# Patient Record
Sex: Female | Born: 1970 | Race: Black or African American | Hispanic: No | Marital: Single | Smoking: Never smoker
Health system: Southern US, Community
[De-identification: ages and names within clinical notes are randomized; demographics above are authoritative.]

## PROBLEM LIST (undated history)

## (undated) DIAGNOSIS — I10 Essential (primary) hypertension: Secondary | ICD-10-CM

## (undated) DIAGNOSIS — Z8619 Personal history of other infectious and parasitic diseases: Secondary | ICD-10-CM

## (undated) DIAGNOSIS — IMO0002 Reserved for concepts with insufficient information to code with codable children: Secondary | ICD-10-CM

## (undated) DIAGNOSIS — D249 Benign neoplasm of unspecified breast: Secondary | ICD-10-CM

## (undated) DIAGNOSIS — E119 Type 2 diabetes mellitus without complications: Secondary | ICD-10-CM

## (undated) DIAGNOSIS — E785 Hyperlipidemia, unspecified: Secondary | ICD-10-CM

## (undated) DIAGNOSIS — Z87898 Personal history of other specified conditions: Secondary | ICD-10-CM

## (undated) DIAGNOSIS — R002 Palpitations: Secondary | ICD-10-CM

## (undated) HISTORY — DX: Personal history of other infectious and parasitic diseases: Z86.19

## (undated) HISTORY — DX: Reserved for concepts with insufficient information to code with codable children: IMO0002

## (undated) HISTORY — DX: Personal history of other specified conditions: Z87.898

## (undated) HISTORY — PX: BREAST EXCISIONAL BIOPSY: SUR124

## (undated) HISTORY — DX: Hyperlipidemia, unspecified: E78.5

## (undated) HISTORY — PX: WISDOM TOOTH EXTRACTION: SHX21

## (undated) HISTORY — DX: Palpitations: R00.2

## (undated) HISTORY — DX: Benign neoplasm of unspecified breast: D24.9

## (undated) HISTORY — DX: Type 2 diabetes mellitus without complications: E11.9

## (undated) HISTORY — DX: Essential (primary) hypertension: I10

## (undated) HISTORY — PX: OTHER SURGICAL HISTORY: SHX169

## (undated) HISTORY — PX: BREAST BIOPSY: SHX20

---
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
## 1991-07-21 DIAGNOSIS — D249 Benign neoplasm of unspecified breast: Secondary | ICD-10-CM

## 1991-07-21 HISTORY — DX: Benign neoplasm of unspecified breast: D24.9

## 2000-07-20 HISTORY — PX: LEEP: SHX91

## 2009-03-04 ENCOUNTER — Encounter: Payer: Self-pay | Admitting: Cardiology

## 2009-07-22 ENCOUNTER — Encounter: Payer: Self-pay | Admitting: Cardiology

## 2009-07-25 DIAGNOSIS — R079 Chest pain, unspecified: Secondary | ICD-10-CM | POA: Insufficient documentation

## 2009-07-26 ENCOUNTER — Ambulatory Visit: Payer: Self-pay | Admitting: Cardiology

## 2009-07-26 DIAGNOSIS — R9431 Abnormal electrocardiogram [ECG] [EKG]: Secondary | ICD-10-CM | POA: Insufficient documentation

## 2009-07-29 ENCOUNTER — Encounter: Payer: Self-pay | Admitting: Cardiology

## 2009-07-31 ENCOUNTER — Encounter: Payer: Self-pay | Admitting: Cardiology

## 2009-08-01 ENCOUNTER — Ambulatory Visit: Payer: Self-pay | Admitting: Cardiology

## 2009-08-01 ENCOUNTER — Encounter: Payer: Self-pay | Admitting: Cardiology

## 2009-08-08 ENCOUNTER — Encounter (INDEPENDENT_AMBULATORY_CARE_PROVIDER_SITE_OTHER): Payer: Self-pay | Admitting: *Deleted

## 2010-08-19 NOTE — Letter (Signed)
Summary: [facility]   Imported By: Marylou Mccoy 09/05/2009 08:34:24  _____________________________________________________________________  External Attachment:    Type:   Image     Comment:   External Document

## 2010-08-19 NOTE — Letter (Signed)
Summary: [facility]   Imported By: Marylou Mccoy 09/05/2009 10:21:13  _____________________________________________________________________  External Attachment:    Type:   Image     Comment:   External Document

## 2010-08-19 NOTE — Letter (Signed)
Summary: Engineer, materials at [facility]  [address]   [place], [place] [address]   Phone: [phone]  Fax: [phone]        August 08, 2009 MRN: [id number]    [facility] [address] [place], [place]  [address]    Dear Ms. Joslyn,  Your test ordered by Selena Batten has been reviewed by your physician (or physician assistant) and was found to be normal or stable. Your physician (or physician assistant) felt no changes were needed at this time.  ____ Echocardiogram  __X__ Cardiac Stress Test-No further cardiac testing planned unless symptoms progress.  ____ Lab Work  ____ Peripheral vascular study of arms, legs or neck  ____ CT scan or X-ray  ____ Lung or Breathing test  ____ Other:   Thank you.   Cyril Loosen, RN, BSN    Duane Boston, M.D., F.A.C.C. Thressa Sheller, M.D., F.A.C.C. Oneal Grout, M.D., F.A.C.C. Cheree Ditto, M.D., F.A.C.C. Daiva Nakayama, M.D., F.A.C.C. Kenney Houseman, M.D., F.A.C.C. Jeanne Ivan, PA-C

## 2010-08-19 NOTE — Assessment & Plan Note (Signed)
Summary: NP-ABNORMAL EKG & ATYPICAL CP   Visit Type:  Initial Consult Primary Provider:  Dr. Isac Sarna   History of Present Illness: 40 year old woman referred with a history of chest pain and abnormal electrocardiogram. She reports a one-year history of intermittent chest pain, typically no more than one occurrence in a month, and described as a sporadic "tightness" with shortness of breath, lasting no more than 5 seconds. The symptoms are nonexertional in nature, and the patient describes problems with "stress" unprompted. She is caring for her mother, with whom she lives, suffering with Alzheimer's dementia and other chronic medical problems.  Ms. Steger denies having any prior cardiac testing. She has rare palpitations, not associated with the present symptoms. She does have some family history, specifically in her mother, but otherwise no major premature cardiovascular disease history in the family. Her electrocardiogram obtained recently on 3 January shows normal sinus rhythm with poor R-wave progression, but not frank anterior Q waves. This may well be related to lead placement and breast tissue, doubt anterior infarction. I reviewed this with her today.  Preventive Screening-Counseling & Management  Alcohol-Tobacco     Smoking Status: never  Current Medications (verified): 1)  None  Allergies (verified): No Known Drug Allergies  Comments:  Nurse/Medical Assistant: The patient is currently on no medications and no changes to the medication list were required.  Past History:  Family History: Last updated: 07/26/2009 Mother had MI age 31 (she had had two strokes prior to the MI). Also has Alzheimers dementia and Diabetes Mellitus. Father deceased with multiple sclerosis. She has siblings who are healthy.  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 Also has Alzheimers dementia and Diabetes Mellitus. Father deceased with multiple sclerosis. She has siblings who are healthy.  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         Otherwise reviewed and negative.  Vital Signs:  Patient profile:   40 year old female Height:      67 inches Weight:      213 pounds BMI:     33.48 O2 Sat:      96 % Pulse rate:   101 / minute BP sitting:   131 / 93  (left arm) Cuff size:   large  Vitals Entered By: Carlye Grippe (July 26, 2009 10:19 AM)  Nutrition Counseling: Patient's BMI is greater than 25 and therefore counseled on weight management options.   Physical Exam  Additional Exam:  Obese woman in no acute distress. HEENT: Conjunctiva and lids normal, oropharynx clear. Neck: Supple, no carotid bruits, no thyromegaly. Lungs: Clear to auscultation, nonlabored. Cardiac: Regular rate and rhythm,  no significant murmur, no S3. Abdomen: Soft, nontender, bowel sounds present, no bruits. Skin: Warm and dry. Extremities: No pitting edema, distal pulses 2+. Musculoskeletal: No kyphosis. Neuropsychiatric: Alert and oriented x3,  affect appropriate.   EKG  Procedure date:  07/22/2009  Findings:      Normal sinus rhythm at 91 beats per minute with poor R-wave progression. Nonspecific T-wave changes.  Impression & Recommendations:  Problem # 1:  CHEST PAIN UNSPECIFIED (ICD-786.50)  Atypical, sporadic symptoms as outlined above over the last year, in the setting of psychosocial stressors. Electrocardiogram is nonspecific. Suspect that poor R-wave progression is related to lead placement or breast tissue. There may be some component of family history of cardiovascular disease in the patient's mother, however no other major cardiac risk factors known at this point. Blood pressure is elevated today, although there is no standing history, with recent blood pressure of 118/84 on evaluation with Dr. Margo Aye. We have discussed the matter today, and will plan an exercise echocardiogram for ischemic surveillance. If this test is low risk, I would not anticipate any further evaluation unless she has progressive symptoms. If significant abnormalities are uncovered, we will see her back and discuss the matter further.  Orders: Echo- Stress (Stress Echo)  Problem # 2:  ABNORMAL ELECTROCARDIOGRAM (ICD-794.31)  As noted above, suspect that this is a nonspecific finding.  Patient Instructions: 1)  exercise echo 2)  Follow up as needed.

## 2010-08-19 NOTE — Letter (Signed)
Summary: [facility]  [facility]   Imported By: Zachary George 07/25/2009 15:30:57  _____________________________________________________________________  External Attachment:    Type:   Image     Comment:   External Document

## 2011-01-23 ENCOUNTER — Encounter: Payer: Self-pay | Admitting: Cardiology

## 2011-02-10 DIAGNOSIS — Z87898 Personal history of other specified conditions: Secondary | ICD-10-CM

## 2011-02-10 HISTORY — DX: Personal history of other specified conditions: Z87.898

## 2012-03-03 ENCOUNTER — Encounter: Payer: Self-pay | Admitting: Obstetrics and Gynecology

## 2012-03-03 ENCOUNTER — Ambulatory Visit (INDEPENDENT_AMBULATORY_CARE_PROVIDER_SITE_OTHER): Payer: BC Managed Care – PPO | Admitting: Obstetrics and Gynecology

## 2012-03-03 VITALS — BP 120/88 | HR 72 | Ht 67.0 in | Wt 211.0 lb

## 2012-03-03 DIAGNOSIS — Z01419 Encounter for gynecological examination (general) (routine) without abnormal findings: Secondary | ICD-10-CM

## 2012-03-03 DIAGNOSIS — Z124 Encounter for screening for malignant neoplasm of cervix: Secondary | ICD-10-CM
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
## 2012-03-03 NOTE — Progress Notes (Signed)
Subjective:    Anne Key is a 41 y.o. female, G0P0, who presents for an annual exam. The patient reports no complaints.  Menstrual cycle:   LMP: Patient's last menstrual period was 02/12/2012.             Review of Systems Pertinent items are noted in HPI. Denies pelvic pain, urinary tract symptoms, vaginitis symptoms, irregular bleeding, menopausal symptoms, change in bowel habits or rectal bleeding   Objective:    BP 120/88  Pulse 72  Ht 5\' 7"  (1.702 m)  Wt 211 lb (95.709 kg)  BMI 33.05 kg/m2  LMP 02/12/2012    Wt Readings from Last 1 Encounters:  03/03/12 211 lb (95.709 kg)   Body mass index is 33.05 kg/(m^2).  General Appearance: Alert, no acute distress HEENT: Grossly normal Neck / Thyroid: Supple, no thyromegaly or cervical adenopathy Lungs: Clear to auscultation bilaterally Back: No CVA tenderness Breast Exam: No masses or nodes.No dimpling, nipple retraction or discharge. Cardiovascular: Regular rate and rhythm.  Gastrointestinal: Soft, non-tender, no masses or organomegaly Pelvic Exam: EGBUS-wnl, vagina-normal rugae, cervix- without lesions or tenderness, uterus appears normal size shape and consistency, adnexae-no masses or tenderness Rectovaginal: no masses and normal sphincter tone Lymphatic Exam: Non-palpable nodes in neck, clavicular,  axillary, or inguinal regions  Skin: no rashes or abnormalities Extremities: no clubbing cyanosis or edema  Neurologic: grossly normal Psychiatric: Alert and oriented  Assessment:   Routine GYN Exam   Plan:    PAP sent  Screening MG @ [facility] in [place]  RTO 1 year or prn  Anntionette Madkins,ELMIRAPA-C

## 2012-03-04 ENCOUNTER — Telehealth: Payer: Self-pay | Admitting: Obstetrics and Gynecology

## 2012-03-04 LAB — PAP IG W/ RFLX HPV ASCU

## 2012-03-04 NOTE — Telephone Encounter (Signed)
[facility] WANTED A WRITTEN ORDER FOR PT MAMMO AND I FAXED IN THE ORDER.

## 2012-03-07 ENCOUNTER — Encounter: Payer: Self-pay | Admitting: Obstetrics and Gynecology

## 2012-03-10 ENCOUNTER — Telehealth: Payer: Self-pay

## 2012-03-10 NOTE — Telephone Encounter (Signed)
Triage/general quest. 

## 2012-03-10 NOTE — Telephone Encounter (Signed)
TC TO PT REGARDING MESSAGE. PT WANTED SUGGESTIONS ON WHAT TO TAKE FOR HOT FLASHES AND I SUGGESTED [employer] TO START WITH AND I TOLD PT SHE CAN GET IT AT [facility] OR [facility] STORE. PT STATES THAT SHE WILL TRY IT AND I INFORMED PT THAT IF SHE GETS NO RESULTS TO CALL BACK. PT VOICED UNDERSTANDING.

## 2012-04-01 ENCOUNTER — Encounter: Payer: Self-pay | Admitting: Obstetrics and Gynecology

## 2012-05-31 ENCOUNTER — Telehealth: Payer: Self-pay | Admitting: Obstetrics and Gynecology

## 2012-05-31 MED ORDER — NORETHIN ACE-ETH ESTRAD-FE 1-20 MG-MCG PO TABS: ORAL_TABLET | ORAL | Status: DC

## 2012-05-31 NOTE — Telephone Encounter (Signed)
Ep to address  

## 2012-05-31 NOTE — Telephone Encounter (Signed)
[name],   Did you get the message from Isabel about this pt?

## 2012-05-31 NOTE — Telephone Encounter (Signed)
Call from patient who has been having frequent hot flashes with rashes.  Was told by Dermatologist to try a trial of hormonal therapy to see if that would help. Patient denies migraines, gallbladder issues or tobacco [place].  To begin Microgestin 1/20  2 today in divided doses, 2 tomorrow then 1 daily 5 refills.  Patient was agreeable.  To follow up in 6 months.  Terianne Thaker, PA-C

## 2015-11-19 ENCOUNTER — Ambulatory Visit
Admission: RE | Admit: 2015-11-19 | Discharge: 2015-11-19 | Disposition: A | Discharge status: Home / Self Care | Payer: BLUE CROSS/BLUE SHIELD | Source: Ambulatory Visit | Attending: Internal Medicine | Admitting: Internal Medicine

## 2015-11-19 ENCOUNTER — Other Ambulatory Visit: Payer: Self-pay | Admitting: Internal Medicine

## 2015-11-19 DIAGNOSIS — R51 Headache: Principal | ICD-10-CM

## 2015-11-19 DIAGNOSIS — R519 Headache, unspecified: Secondary | ICD-10-CM

## 2016-02-18 DIAGNOSIS — L709 Acne, unspecified: Secondary | ICD-10-CM | POA: Diagnosis not present

## 2016-02-18 DIAGNOSIS — L639 Alopecia areata, unspecified: Secondary | ICD-10-CM | POA: Diagnosis not present

## 2016-04-24 DIAGNOSIS — Z6835 Body mass index (BMI) 35.0-35.9, adult: Secondary | ICD-10-CM | POA: Diagnosis not present

## 2016-04-24 DIAGNOSIS — Z124 Encounter for screening for malignant neoplasm of cervix: Secondary | ICD-10-CM | POA: Diagnosis not present

## 2016-04-24 DIAGNOSIS — Z3040 Encounter for surveillance of contraceptives, unspecified: Secondary | ICD-10-CM | POA: Diagnosis not present

## 2016-04-24 DIAGNOSIS — Z1231 Encounter for screening mammogram for malignant neoplasm of breast: Secondary | ICD-10-CM | POA: Diagnosis not present

## 2016-04-24 DIAGNOSIS — Z01419 Encounter for gynecological examination (general) (routine) without abnormal findings: Secondary | ICD-10-CM | POA: Diagnosis not present

## 2016-04-28 DIAGNOSIS — L309 Dermatitis, unspecified: Secondary | ICD-10-CM | POA: Diagnosis not present

## 2016-04-28 DIAGNOSIS — L659 Nonscarring hair loss, unspecified: Secondary | ICD-10-CM | POA: Diagnosis not present

## 2016-08-13 DIAGNOSIS — R399 Unspecified symptoms and signs involving the genitourinary system: Secondary | ICD-10-CM | POA: Diagnosis not present

## 2016-08-13 DIAGNOSIS — J209 Acute bronchitis, unspecified: Secondary | ICD-10-CM | POA: Diagnosis not present

## 2016-09-24 DIAGNOSIS — M79671 Pain in right foot: Secondary | ICD-10-CM | POA: Diagnosis not present

## 2016-09-24 DIAGNOSIS — M722 Plantar fascial fibromatosis: Secondary | ICD-10-CM | POA: Diagnosis not present

## 2016-10-14 DIAGNOSIS — M722 Plantar fascial fibromatosis: Secondary | ICD-10-CM | POA: Diagnosis not present

## 2016-10-14 DIAGNOSIS — M79671 Pain in right foot: Secondary | ICD-10-CM | POA: Diagnosis not present

## 2016-11-04 DIAGNOSIS — M79671 Pain in right foot: Secondary | ICD-10-CM | POA: Diagnosis not present

## 2016-11-04 DIAGNOSIS — M722 Plantar fascial fibromatosis: Secondary | ICD-10-CM | POA: Diagnosis not present

## 2016-12-02 DIAGNOSIS — M722 Plantar fascial fibromatosis: Secondary | ICD-10-CM | POA: Diagnosis not present

## 2016-12-02 DIAGNOSIS — M79671 Pain in right foot: Secondary | ICD-10-CM | POA: Diagnosis not present

## 2016-12-03 DIAGNOSIS — R42 Dizziness and giddiness: Secondary | ICD-10-CM | POA: Diagnosis not present

## 2016-12-03 DIAGNOSIS — L258 Unspecified contact dermatitis due to other agents: Secondary | ICD-10-CM | POA: Diagnosis not present

## 2016-12-03 DIAGNOSIS — N390 Urinary tract infection, site not specified: Secondary | ICD-10-CM | POA: Diagnosis not present

## 2016-12-03 DIAGNOSIS — R5383 Other fatigue: Secondary | ICD-10-CM | POA: Diagnosis not present

## 2016-12-10 DIAGNOSIS — R42 Dizziness and giddiness: Secondary | ICD-10-CM | POA: Diagnosis not present

## 2016-12-10 DIAGNOSIS — I10 Essential (primary) hypertension: Secondary | ICD-10-CM | POA: Diagnosis not present

## 2016-12-10 DIAGNOSIS — J309 Allergic rhinitis, unspecified: Secondary | ICD-10-CM | POA: Diagnosis not present

## 2016-12-10 DIAGNOSIS — R5383 Other fatigue: Secondary | ICD-10-CM | POA: Diagnosis not present

## 2016-12-22 DIAGNOSIS — E559 Vitamin D deficiency, unspecified: Secondary | ICD-10-CM | POA: Diagnosis not present

## 2016-12-22 DIAGNOSIS — E118 Type 2 diabetes mellitus with unspecified complications: Secondary | ICD-10-CM | POA: Diagnosis not present

## 2016-12-22 DIAGNOSIS — I10 Essential (primary) hypertension: Secondary | ICD-10-CM | POA: Diagnosis not present

## 2016-12-22 DIAGNOSIS — N390 Urinary tract infection, site not specified: Secondary | ICD-10-CM | POA: Diagnosis not present

## 2016-12-22 DIAGNOSIS — E7800 Pure hypercholesterolemia, unspecified: Secondary | ICD-10-CM | POA: Diagnosis not present

## 2016-12-29 DIAGNOSIS — I10 Essential (primary) hypertension: Secondary | ICD-10-CM | POA: Diagnosis not present

## 2016-12-29 DIAGNOSIS — E119 Type 2 diabetes mellitus without complications: Secondary | ICD-10-CM | POA: Diagnosis not present

## 2016-12-29 DIAGNOSIS — Z0000 Encounter for general adult medical examination without abnormal findings: Secondary | ICD-10-CM | POA: Diagnosis not present

## 2016-12-29 DIAGNOSIS — E7800 Pure hypercholesterolemia, unspecified: Secondary | ICD-10-CM | POA: Diagnosis not present

## 2017-04-02 DIAGNOSIS — E118 Type 2 diabetes mellitus with unspecified complications: Secondary | ICD-10-CM | POA: Diagnosis not present

## 2017-04-02 DIAGNOSIS — I10 Essential (primary) hypertension: Secondary | ICD-10-CM | POA: Diagnosis not present

## 2017-04-02 DIAGNOSIS — E7800 Pure hypercholesterolemia, unspecified: Secondary | ICD-10-CM | POA: Diagnosis not present

## 2017-04-26 DIAGNOSIS — Z6835 Body mass index (BMI) 35.0-35.9, adult: Secondary | ICD-10-CM | POA: Diagnosis not present

## 2017-04-26 DIAGNOSIS — Z1231 Encounter for screening mammogram for malignant neoplasm of breast: Secondary | ICD-10-CM | POA: Diagnosis not present

## 2017-04-26 DIAGNOSIS — Z01419 Encounter for gynecological examination (general) (routine) without abnormal findings: Secondary | ICD-10-CM | POA: Diagnosis not present

## 2017-04-26 DIAGNOSIS — Z124 Encounter for screening for malignant neoplasm of cervix: Secondary | ICD-10-CM | POA: Diagnosis not present

## 2017-05-07 DIAGNOSIS — H3561 Retinal hemorrhage, right eye: Secondary | ICD-10-CM | POA: Diagnosis not present

## 2017-06-24 DIAGNOSIS — H40013 Open angle with borderline findings, low risk, bilateral: Secondary | ICD-10-CM | POA: Diagnosis not present

## 2017-07-01 DIAGNOSIS — E118 Type 2 diabetes mellitus with unspecified complications: Secondary | ICD-10-CM | POA: Diagnosis not present

## 2017-07-01 DIAGNOSIS — I10 Essential (primary) hypertension: Secondary | ICD-10-CM | POA: Diagnosis not present

## 2017-08-02 DIAGNOSIS — E7800 Pure hypercholesterolemia, unspecified: Secondary | ICD-10-CM | POA: Diagnosis not present

## 2017-08-02 DIAGNOSIS — I10 Essential (primary) hypertension: Secondary | ICD-10-CM | POA: Diagnosis not present

## 2017-08-02 DIAGNOSIS — E118 Type 2 diabetes mellitus with unspecified complications: Secondary | ICD-10-CM | POA: Diagnosis not present

## 2017-09-21 IMAGING — CT CT HEAD W/O CM
3 of 4 series · 16 of 47 positions shown, 19 images · non-contrast
Comparison: None.

CLINICAL DATA: 44-year-old female with headaches for 2 weeks. Acute
intractable headache. Dizziness. No known injury. Initial encounter.

EXAM:
CT HEAD WITHOUT CONTRAST
TECHNIQUE: Contiguous axial images were obtained from the base of the skull
through the vertex without intravenous contrast.

[Series 32: 3d filtered head w/o · axial · non-contrast · 0.49mm/px · z∈[-31,+114]mm · 10 of 35 slices shown, 13 images]
[im 3/35  brain]
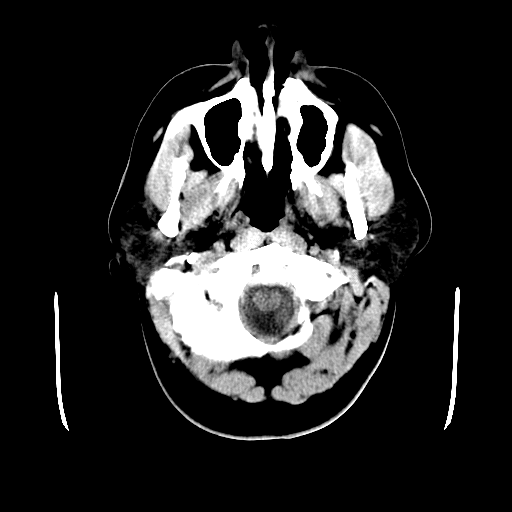
[im 3/35  bone]
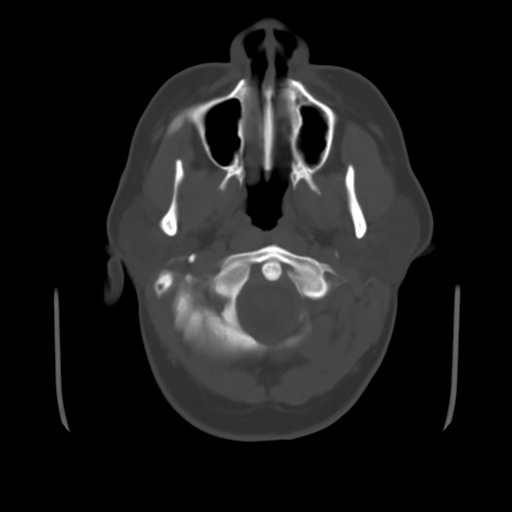
[im 5/35  brain]
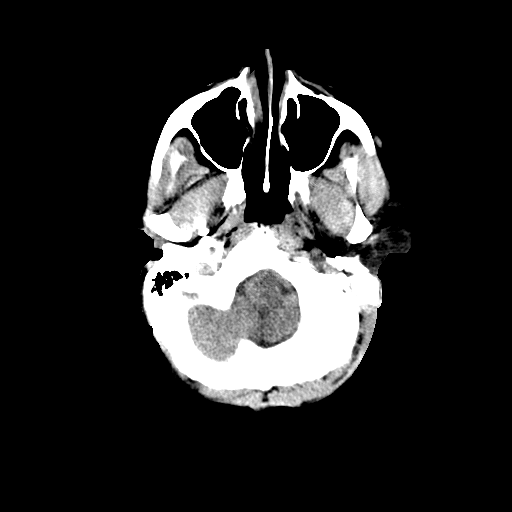
[im 10/35  brain]
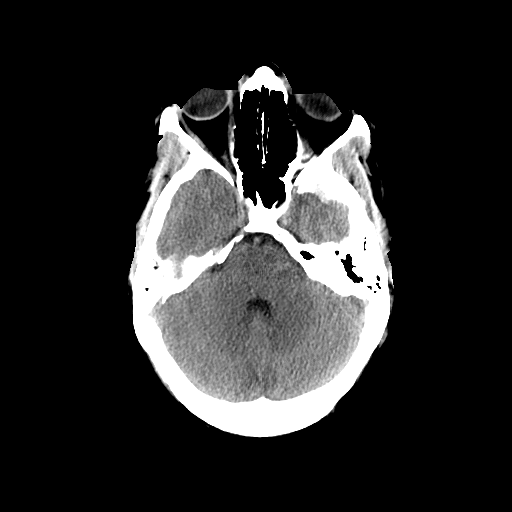
[im 13/35  brain]
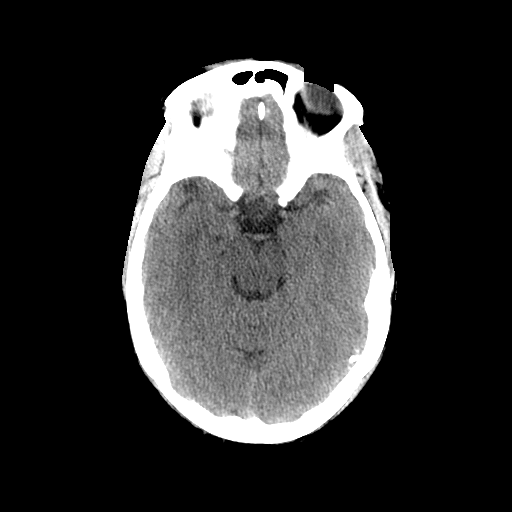
[im 15/35  brain]
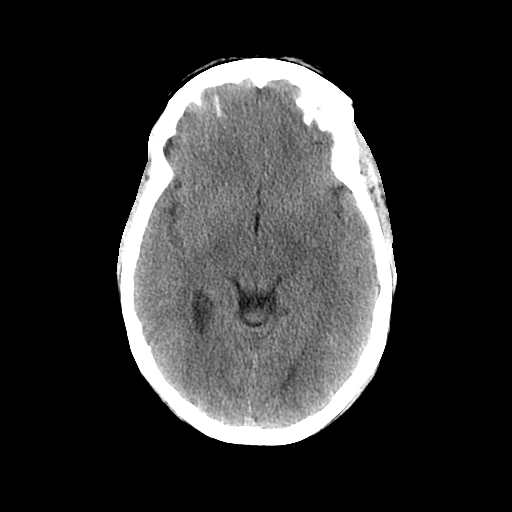
[im 15/35  bone]
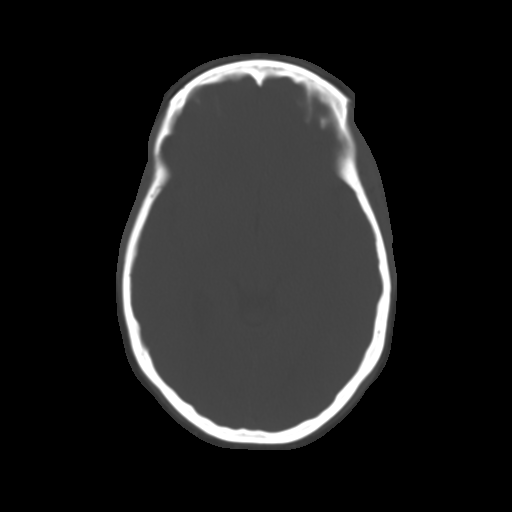
[im 20/35  brain]
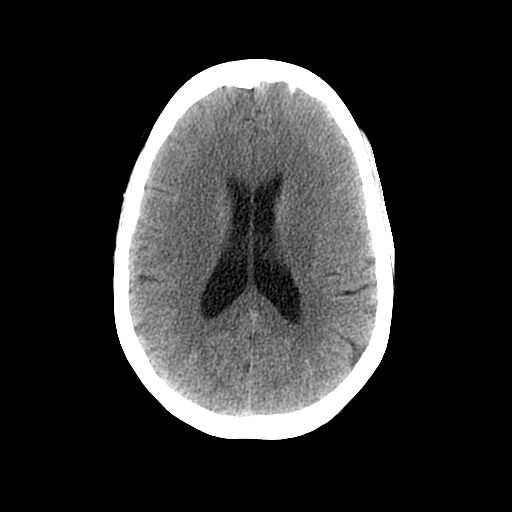
[im 22/35  brain]
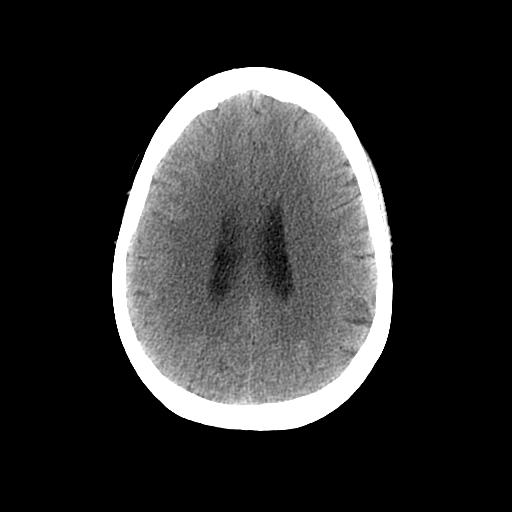
[im 25/35  brain]
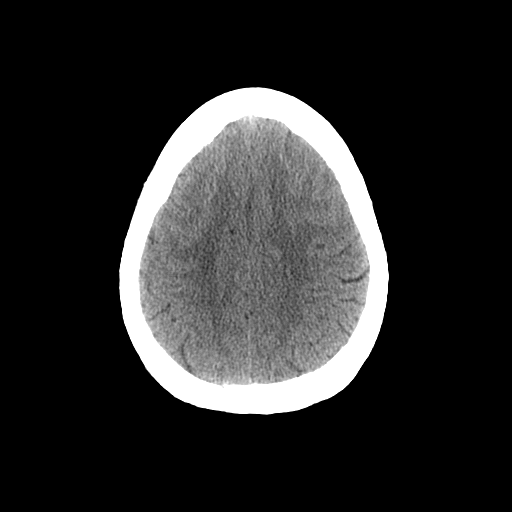
[im 30/35  brain]
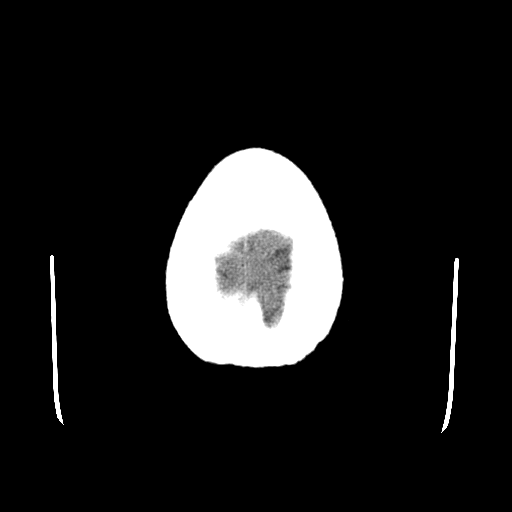
[im 30/35  bone]
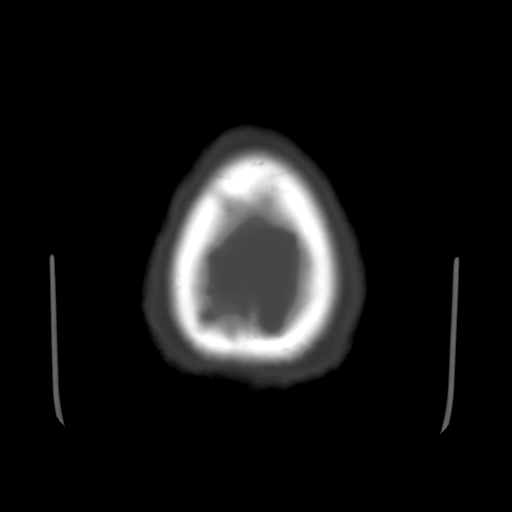
[im 32/35  brain]
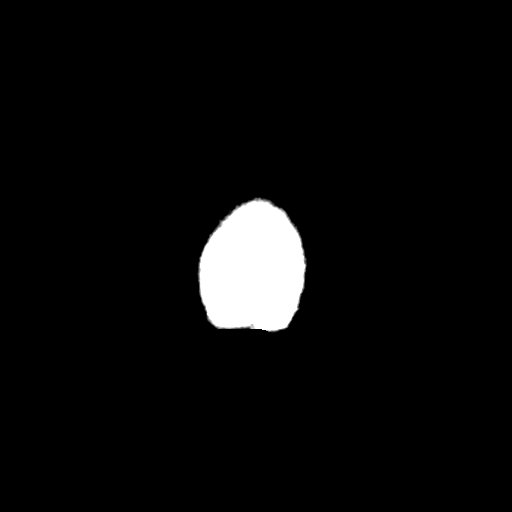

[Series 601: coronal brain · coronal · 0.49mm/px · 3 of 75 slices shown]
[im 25/75  brain]
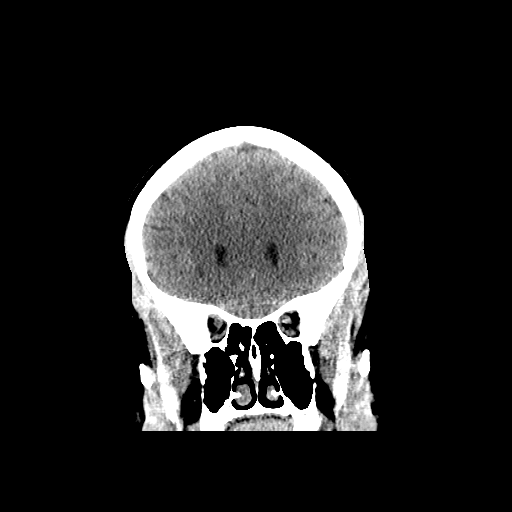
[im 33/75  brain]
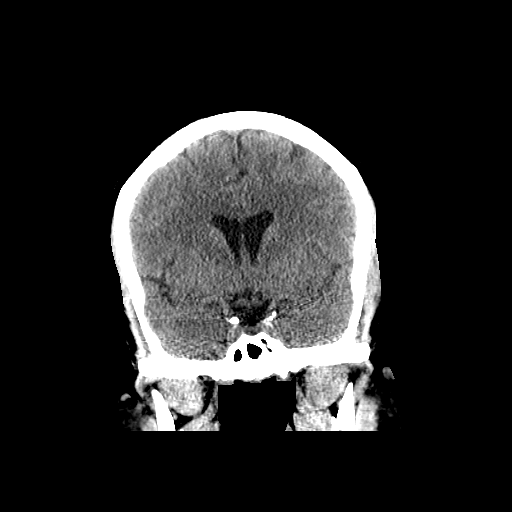
[im 42/75  brain]
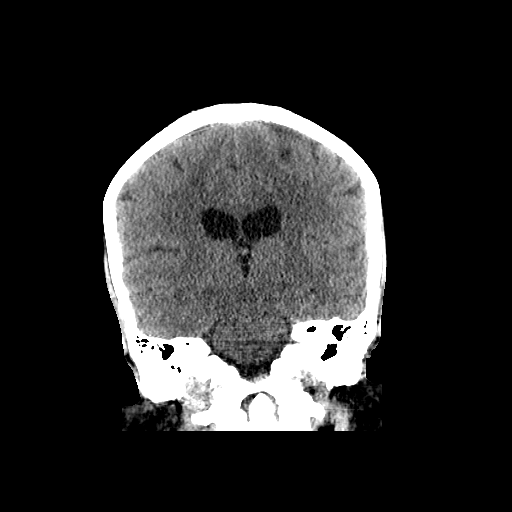

[Series 602: sagittal brain · sagittal · 0.49mm/px · 3 of 58 slices shown]
[im 20/58  brain]
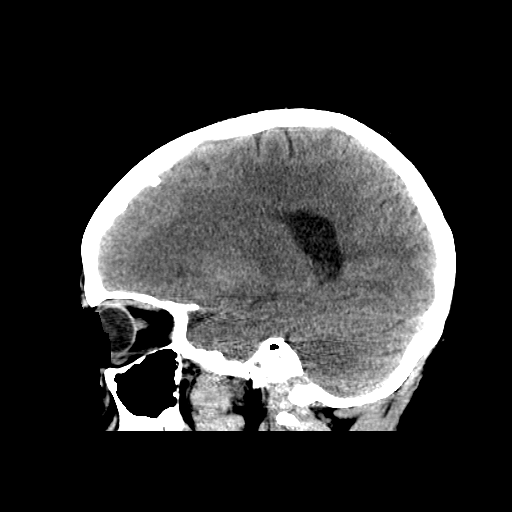
[im 29/58  brain]
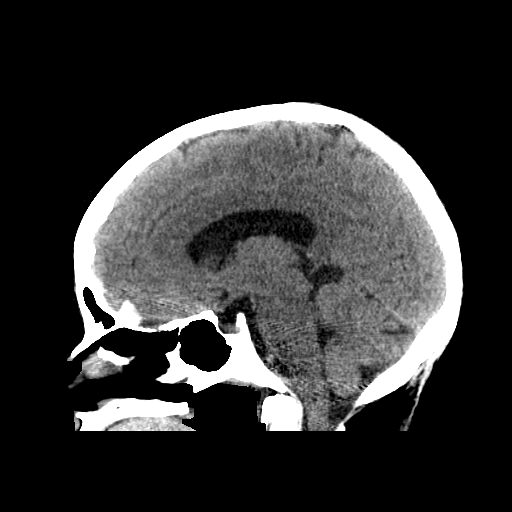
[im 39/58  brain]
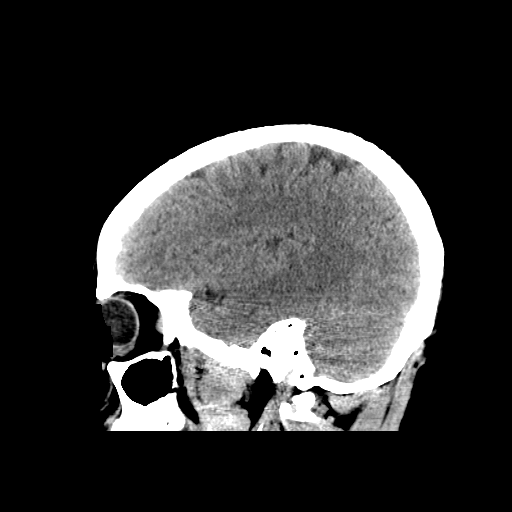

[16 of 47 positions shown; findings below may reference images not displayed]

FINDINGS: Visualized paranasal sinuses and mastoids are clear. No osseous
abnormality identified. Visualized orbits and scalp soft tissues are
within normal limits.

Cerebral volume is within normal limits. No midline shift,
ventriculomegaly, mass effect, evidence of mass lesion, intracranial
hemorrhage or evidence of cortically based acute infarction.
Gray-white matter differentiation is within normal limits throughout
the brain. No suspicious intracranial vascular hyperdensity.
IMPRESSION: Normal noncontrast CT appearance of the brain.

## 2017-12-24 DIAGNOSIS — E118 Type 2 diabetes mellitus with unspecified complications: Secondary | ICD-10-CM | POA: Diagnosis not present

## 2017-12-24 DIAGNOSIS — I10 Essential (primary) hypertension: Secondary | ICD-10-CM | POA: Diagnosis not present

## 2017-12-24 DIAGNOSIS — N390 Urinary tract infection, site not specified: Secondary | ICD-10-CM | POA: Diagnosis not present

## 2017-12-31 DIAGNOSIS — Z0000 Encounter for general adult medical examination without abnormal findings: Secondary | ICD-10-CM | POA: Diagnosis not present

## 2017-12-31 DIAGNOSIS — E118 Type 2 diabetes mellitus with unspecified complications: Secondary | ICD-10-CM | POA: Diagnosis not present

## 2017-12-31 DIAGNOSIS — I10 Essential (primary) hypertension: Secondary | ICD-10-CM | POA: Diagnosis not present

## 2017-12-31 DIAGNOSIS — E7800 Pure hypercholesterolemia, unspecified: Secondary | ICD-10-CM | POA: Diagnosis not present

## 2018-03-28 DIAGNOSIS — I10 Essential (primary) hypertension: Secondary | ICD-10-CM | POA: Diagnosis not present

## 2018-03-28 DIAGNOSIS — E7800 Pure hypercholesterolemia, unspecified: Secondary | ICD-10-CM | POA: Diagnosis not present

## 2018-03-28 DIAGNOSIS — E119 Type 2 diabetes mellitus without complications: Secondary | ICD-10-CM | POA: Diagnosis not present

## 2018-04-05 DIAGNOSIS — E559 Vitamin D deficiency, unspecified: Secondary | ICD-10-CM | POA: Diagnosis not present

## 2018-04-05 DIAGNOSIS — I10 Essential (primary) hypertension: Secondary | ICD-10-CM | POA: Diagnosis not present

## 2018-04-05 DIAGNOSIS — E7800 Pure hypercholesterolemia, unspecified: Secondary | ICD-10-CM | POA: Diagnosis not present

## 2018-04-05 DIAGNOSIS — E119 Type 2 diabetes mellitus without complications: Secondary | ICD-10-CM | POA: Diagnosis not present

## 2018-04-20 DIAGNOSIS — E7800 Pure hypercholesterolemia, unspecified: Secondary | ICD-10-CM | POA: Diagnosis not present

## 2018-04-20 DIAGNOSIS — I10 Essential (primary) hypertension: Secondary | ICD-10-CM | POA: Diagnosis not present

## 2018-04-20 DIAGNOSIS — E119 Type 2 diabetes mellitus without complications: Secondary | ICD-10-CM | POA: Diagnosis not present

## 2018-04-22 DIAGNOSIS — H40013 Open angle with borderline findings, low risk, bilateral: Secondary | ICD-10-CM | POA: Diagnosis not present

## 2018-04-27 DIAGNOSIS — Z01419 Encounter for gynecological examination (general) (routine) without abnormal findings: Secondary | ICD-10-CM | POA: Diagnosis not present

## 2018-04-27 DIAGNOSIS — Z124 Encounter for screening for malignant neoplasm of cervix: Secondary | ICD-10-CM | POA: Diagnosis not present

## 2018-04-27 DIAGNOSIS — Z6835 Body mass index (BMI) 35.0-35.9, adult: Secondary | ICD-10-CM | POA: Diagnosis not present

## 2018-04-27 DIAGNOSIS — Z1231 Encounter for screening mammogram for malignant neoplasm of breast: Secondary | ICD-10-CM | POA: Diagnosis not present

## 2018-05-11 DIAGNOSIS — N6009 Solitary cyst of unspecified breast: Secondary | ICD-10-CM | POA: Diagnosis not present

## 2018-05-11 DIAGNOSIS — R928 Other abnormal and inconclusive findings on diagnostic imaging of breast: Secondary | ICD-10-CM | POA: Diagnosis not present

## 2018-07-21 DIAGNOSIS — E119 Type 2 diabetes mellitus without complications: Secondary | ICD-10-CM | POA: Diagnosis not present

## 2018-07-21 DIAGNOSIS — I10 Essential (primary) hypertension: Secondary | ICD-10-CM | POA: Diagnosis not present

## 2018-07-28 DIAGNOSIS — E118 Type 2 diabetes mellitus with unspecified complications: Secondary | ICD-10-CM | POA: Diagnosis not present

## 2018-07-28 DIAGNOSIS — I10 Essential (primary) hypertension: Secondary | ICD-10-CM | POA: Diagnosis not present

## 2018-07-28 DIAGNOSIS — J329 Chronic sinusitis, unspecified: Secondary | ICD-10-CM | POA: Diagnosis not present

## 2018-07-28 DIAGNOSIS — E7800 Pure hypercholesterolemia, unspecified: Secondary | ICD-10-CM | POA: Diagnosis not present

## 2018-08-04 DIAGNOSIS — H40013 Open angle with borderline findings, low risk, bilateral: Secondary | ICD-10-CM | POA: Diagnosis not present

## 2019-01-26 DIAGNOSIS — I10 Essential (primary) hypertension: Secondary | ICD-10-CM | POA: Diagnosis not present

## 2019-01-26 DIAGNOSIS — E118 Type 2 diabetes mellitus with unspecified complications: Secondary | ICD-10-CM | POA: Diagnosis not present

## 2019-01-26 DIAGNOSIS — E7800 Pure hypercholesterolemia, unspecified: Secondary | ICD-10-CM | POA: Diagnosis not present

## 2019-02-01 DIAGNOSIS — I10 Essential (primary) hypertension: Secondary | ICD-10-CM | POA: Diagnosis not present

## 2019-02-01 DIAGNOSIS — Z0000 Encounter for general adult medical examination without abnormal findings: Secondary | ICD-10-CM | POA: Diagnosis not present

## 2019-04-11 DIAGNOSIS — E1165 Type 2 diabetes mellitus with hyperglycemia: Secondary | ICD-10-CM | POA: Diagnosis not present

## 2019-04-11 DIAGNOSIS — E7800 Pure hypercholesterolemia, unspecified: Secondary | ICD-10-CM | POA: Diagnosis not present

## 2019-04-11 DIAGNOSIS — I10 Essential (primary) hypertension: Secondary | ICD-10-CM | POA: Diagnosis not present

## 2019-06-05 DIAGNOSIS — Z01419 Encounter for gynecological examination (general) (routine) without abnormal findings: Secondary | ICD-10-CM | POA: Diagnosis not present

## 2019-06-05 DIAGNOSIS — E1165 Type 2 diabetes mellitus with hyperglycemia: Secondary | ICD-10-CM | POA: Diagnosis not present

## 2019-06-05 DIAGNOSIS — Z1231 Encounter for screening mammogram for malignant neoplasm of breast: Secondary | ICD-10-CM | POA: Diagnosis not present

## 2019-06-05 DIAGNOSIS — Z6833 Body mass index (BMI) 33.0-33.9, adult: Secondary | ICD-10-CM | POA: Diagnosis not present

## 2019-06-05 DIAGNOSIS — E7800 Pure hypercholesterolemia, unspecified: Secondary | ICD-10-CM | POA: Diagnosis not present

## 2019-06-05 DIAGNOSIS — Z3040 Encounter for surveillance of contraceptives, unspecified: Secondary | ICD-10-CM | POA: Diagnosis not present

## 2019-06-05 DIAGNOSIS — I10 Essential (primary) hypertension: Secondary | ICD-10-CM | POA: Diagnosis not present

## 2019-06-05 DIAGNOSIS — Z124 Encounter for screening for malignant neoplasm of cervix: Secondary | ICD-10-CM | POA: Diagnosis not present

## 2019-06-08 DIAGNOSIS — E7800 Pure hypercholesterolemia, unspecified: Secondary | ICD-10-CM | POA: Diagnosis not present

## 2019-06-08 DIAGNOSIS — E119 Type 2 diabetes mellitus without complications: Secondary | ICD-10-CM | POA: Diagnosis not present

## 2019-06-08 DIAGNOSIS — I10 Essential (primary) hypertension: Secondary | ICD-10-CM | POA: Diagnosis not present

## 2019-07-10 DIAGNOSIS — H40013 Open angle with borderline findings, low risk, bilateral: Secondary | ICD-10-CM | POA: Diagnosis not present

## 2019-08-01 DIAGNOSIS — E118 Type 2 diabetes mellitus with unspecified complications: Secondary | ICD-10-CM | POA: Diagnosis not present

## 2019-08-16 DIAGNOSIS — E118 Type 2 diabetes mellitus with unspecified complications: Secondary | ICD-10-CM | POA: Diagnosis not present

## 2019-08-16 DIAGNOSIS — E7800 Pure hypercholesterolemia, unspecified: Secondary | ICD-10-CM | POA: Diagnosis not present

## 2019-08-16 DIAGNOSIS — I10 Essential (primary) hypertension: Secondary | ICD-10-CM | POA: Diagnosis not present

## 2019-08-16 DIAGNOSIS — E6609 Other obesity due to excess calories: Secondary | ICD-10-CM | POA: Diagnosis not present

## 2020-01-31 DIAGNOSIS — E7800 Pure hypercholesterolemia, unspecified: Secondary | ICD-10-CM | POA: Diagnosis not present

## 2020-01-31 DIAGNOSIS — E118 Type 2 diabetes mellitus with unspecified complications: Secondary | ICD-10-CM | POA: Diagnosis not present

## 2020-01-31 DIAGNOSIS — Z0000 Encounter for general adult medical examination without abnormal findings: Secondary | ICD-10-CM | POA: Diagnosis not present

## 2020-01-31 DIAGNOSIS — I10 Essential (primary) hypertension: Secondary | ICD-10-CM | POA: Diagnosis not present

## 2020-02-07 DIAGNOSIS — Z0000 Encounter for general adult medical examination without abnormal findings: Secondary | ICD-10-CM | POA: Diagnosis not present

## 2020-02-07 DIAGNOSIS — I10 Essential (primary) hypertension: Secondary | ICD-10-CM | POA: Diagnosis not present

## 2020-02-07 DIAGNOSIS — E119 Type 2 diabetes mellitus without complications: Secondary | ICD-10-CM | POA: Diagnosis not present

## 2020-02-07 DIAGNOSIS — E7800 Pure hypercholesterolemia, unspecified: Secondary | ICD-10-CM | POA: Diagnosis not present

## 2020-04-09 DIAGNOSIS — E7800 Pure hypercholesterolemia, unspecified: Secondary | ICD-10-CM | POA: Diagnosis not present

## 2020-04-09 DIAGNOSIS — I10 Essential (primary) hypertension: Secondary | ICD-10-CM | POA: Diagnosis not present

## 2020-06-05 DIAGNOSIS — Z6833 Body mass index (BMI) 33.0-33.9, adult: Secondary | ICD-10-CM | POA: Diagnosis not present

## 2020-06-05 DIAGNOSIS — Z01419 Encounter for gynecological examination (general) (routine) without abnormal findings: Secondary | ICD-10-CM | POA: Diagnosis not present

## 2020-06-05 DIAGNOSIS — Z1231 Encounter for screening mammogram for malignant neoplasm of breast: Secondary | ICD-10-CM | POA: Diagnosis not present

## 2020-08-12 DIAGNOSIS — I10 Essential (primary) hypertension: Secondary | ICD-10-CM | POA: Diagnosis not present

## 2020-08-12 DIAGNOSIS — E559 Vitamin D deficiency, unspecified: Secondary | ICD-10-CM | POA: Diagnosis not present

## 2020-08-12 DIAGNOSIS — E7800 Pure hypercholesterolemia, unspecified: Secondary | ICD-10-CM | POA: Diagnosis not present

## 2020-08-12 DIAGNOSIS — E119 Type 2 diabetes mellitus without complications: Secondary | ICD-10-CM | POA: Diagnosis not present

## 2020-08-23 DIAGNOSIS — Z20822 Contact with and (suspected) exposure to covid-19: Secondary | ICD-10-CM | POA: Diagnosis not present

## 2021-01-03 DIAGNOSIS — H40013 Open angle with borderline findings, low risk, bilateral: Secondary | ICD-10-CM | POA: Diagnosis not present

## 2021-02-27 DIAGNOSIS — Z0000 Encounter for general adult medical examination without abnormal findings: Secondary | ICD-10-CM | POA: Diagnosis not present

## 2021-02-27 DIAGNOSIS — E7800 Pure hypercholesterolemia, unspecified: Secondary | ICD-10-CM | POA: Diagnosis not present

## 2021-02-27 DIAGNOSIS — E559 Vitamin D deficiency, unspecified: Secondary | ICD-10-CM | POA: Diagnosis not present

## 2021-05-14 DIAGNOSIS — E119 Type 2 diabetes mellitus without complications: Secondary | ICD-10-CM | POA: Diagnosis not present

## 2021-05-14 DIAGNOSIS — Z0000 Encounter for general adult medical examination without abnormal findings: Secondary | ICD-10-CM | POA: Diagnosis not present

## 2021-05-14 DIAGNOSIS — E7800 Pure hypercholesterolemia, unspecified: Secondary | ICD-10-CM | POA: Diagnosis not present

## 2021-05-14 DIAGNOSIS — I10 Essential (primary) hypertension: Secondary | ICD-10-CM | POA: Diagnosis not present

## 2021-06-06 DIAGNOSIS — Z01419 Encounter for gynecological examination (general) (routine) without abnormal findings: Secondary | ICD-10-CM | POA: Diagnosis not present

## 2021-06-06 DIAGNOSIS — Z6833 Body mass index (BMI) 33.0-33.9, adult: Secondary | ICD-10-CM | POA: Diagnosis not present

## 2021-06-06 DIAGNOSIS — Z8751 Personal history of pre-term labor: Secondary | ICD-10-CM | POA: Diagnosis not present

## 2021-06-17 DIAGNOSIS — E7800 Pure hypercholesterolemia, unspecified: Secondary | ICD-10-CM | POA: Diagnosis not present

## 2021-06-17 DIAGNOSIS — E118 Type 2 diabetes mellitus with unspecified complications: Secondary | ICD-10-CM | POA: Diagnosis not present

## 2021-06-17 DIAGNOSIS — I10 Essential (primary) hypertension: Secondary | ICD-10-CM | POA: Diagnosis not present

## 2021-09-17 DIAGNOSIS — E7801 Familial hypercholesterolemia: Secondary | ICD-10-CM | POA: Diagnosis not present

## 2021-09-17 DIAGNOSIS — E119 Type 2 diabetes mellitus without complications: Secondary | ICD-10-CM | POA: Diagnosis not present

## 2021-09-29 DIAGNOSIS — E559 Vitamin D deficiency, unspecified: Secondary | ICD-10-CM | POA: Diagnosis not present

## 2021-09-29 DIAGNOSIS — E119 Type 2 diabetes mellitus without complications: Secondary | ICD-10-CM | POA: Diagnosis not present

## 2021-09-29 DIAGNOSIS — E7800 Pure hypercholesterolemia, unspecified: Secondary | ICD-10-CM | POA: Diagnosis not present

## 2021-09-29 DIAGNOSIS — I10 Essential (primary) hypertension: Secondary | ICD-10-CM | POA: Diagnosis not present

## 2021-11-25 DIAGNOSIS — I10 Essential (primary) hypertension: Secondary | ICD-10-CM | POA: Diagnosis not present

## 2021-11-25 DIAGNOSIS — E118 Type 2 diabetes mellitus with unspecified complications: Secondary | ICD-10-CM | POA: Diagnosis not present

## 2021-11-25 DIAGNOSIS — E7800 Pure hypercholesterolemia, unspecified: Secondary | ICD-10-CM | POA: Diagnosis not present

## 2022-04-21 DIAGNOSIS — E7800 Pure hypercholesterolemia, unspecified: Secondary | ICD-10-CM | POA: Diagnosis not present

## 2022-04-21 DIAGNOSIS — E1169 Type 2 diabetes mellitus with other specified complication: Secondary | ICD-10-CM | POA: Diagnosis not present

## 2022-04-21 DIAGNOSIS — I10 Essential (primary) hypertension: Secondary | ICD-10-CM | POA: Diagnosis not present

## 2022-04-21 DIAGNOSIS — E1165 Type 2 diabetes mellitus with hyperglycemia: Secondary | ICD-10-CM | POA: Diagnosis not present

## 2022-04-21 DIAGNOSIS — E559 Vitamin D deficiency, unspecified: Secondary | ICD-10-CM | POA: Diagnosis not present

## 2022-06-10 DIAGNOSIS — Z124 Encounter for screening for malignant neoplasm of cervix: Secondary | ICD-10-CM | POA: Diagnosis not present

## 2022-06-10 DIAGNOSIS — Z01419 Encounter for gynecological examination (general) (routine) without abnormal findings: Secondary | ICD-10-CM | POA: Diagnosis not present

## 2022-06-10 DIAGNOSIS — Z6833 Body mass index (BMI) 33.0-33.9, adult: Secondary | ICD-10-CM | POA: Diagnosis not present

## 2022-06-10 DIAGNOSIS — Z1231 Encounter for screening mammogram for malignant neoplasm of breast: Secondary | ICD-10-CM | POA: Diagnosis not present

## 2022-06-17 DIAGNOSIS — Z0000 Encounter for general adult medical examination without abnormal findings: Secondary | ICD-10-CM | POA: Diagnosis not present

## 2022-06-17 DIAGNOSIS — E7800 Pure hypercholesterolemia, unspecified: Secondary | ICD-10-CM | POA: Diagnosis not present

## 2022-06-17 DIAGNOSIS — E118 Type 2 diabetes mellitus with unspecified complications: Secondary | ICD-10-CM | POA: Diagnosis not present

## 2022-06-17 DIAGNOSIS — I10 Essential (primary) hypertension: Secondary | ICD-10-CM | POA: Diagnosis not present

## 2022-06-23 DIAGNOSIS — Z0000 Encounter for general adult medical examination without abnormal findings: Secondary | ICD-10-CM | POA: Diagnosis not present

## 2022-06-29 DIAGNOSIS — N939 Abnormal uterine and vaginal bleeding, unspecified: Secondary | ICD-10-CM | POA: Diagnosis not present

## 2022-06-29 DIAGNOSIS — D259 Leiomyoma of uterus, unspecified: Secondary | ICD-10-CM | POA: Diagnosis not present

## 2022-07-29 ENCOUNTER — Encounter: Payer: Self-pay | Admitting: Gastroenterology

## 2022-08-27 ENCOUNTER — Ambulatory Visit: Payer: BC Managed Care – PPO

## 2022-08-27 VITALS — Ht 67.0 in | Wt 206.0 lb

## 2022-08-27 DIAGNOSIS — Z1211 Encounter for screening for malignant neoplasm of colon: Secondary | ICD-10-CM

## 2022-08-27 MED ORDER — NA SULFATE-K SULFATE-MG SULF 17.5-3.13-1.6 GM/177ML PO SOLN: 1.0000 | Freq: Once | ORAL | 0 refills | Status: AC

## 2022-08-27 NOTE — Progress Notes (Signed)
Pt's previsit is done over the phone and all paperwork (prep instructions) sent to patient.  Pt's name and DOB verified at the beginning of the previsit.  Pt denies any difficulty with ambulating.   No egg or soy allergy known to patient  No issues known to pt with past sedation with any surgeries or procedures Pt has not been intubated, denies trouble moving neck No FH of Malignant Hyperthermia Pt is not on diet pills Pt is not on  home 02  Pt is not on blood thinners  Pt denies issues with constipation  Pt is not on dialysis Pt denies any upcoming cardiac testing Pt encouraged to use to use Singlecare or Goodrx to reduce cost  Patient's chart reviewed by Osvaldo Angst CNRA prior to previsit and patient appropriate for the [facility].  Previsit completed and red dot placed by patient's name on their procedure day (on provider's schedule).

## 2022-08-28 ENCOUNTER — Encounter: Payer: Self-pay | Admitting: Gastroenterology

## 2022-09-23 ENCOUNTER — Ambulatory Visit (AMBULATORY_SURGERY_CENTER): Payer: BC Managed Care – PPO | Admitting: Gastroenterology

## 2022-09-23 ENCOUNTER — Encounter: Payer: Self-pay | Admitting: Gastroenterology

## 2022-09-23 VITALS — BP 125/85 | HR 76 | Temp 98.4°F | Resp 22 | Ht 67.0 in | Wt 206.0 lb

## 2022-09-23 DIAGNOSIS — D123 Benign neoplasm of transverse colon: Secondary | ICD-10-CM

## 2022-09-23 DIAGNOSIS — Z1211 Encounter for screening for malignant neoplasm of colon: Secondary | ICD-10-CM

## 2022-09-23 MED ORDER — SODIUM CHLORIDE 0.9 % IV SOLN: 500.0000 mL | Freq: Once | INTRAVENOUS | Status: DC

## 2022-09-23 NOTE — Patient Instructions (Signed)
Please read handouts provided. Continue present medications. Await pathology results.   YOU HAD AN ENDOSCOPIC PROCEDURE TODAY AT THE [facility]:   Refer to the procedure report that was given to you for any specific questions about what was found during the examination.  If the procedure report does not answer your questions, please call your gastroenterologist to clarify.  If you requested that your care partner not be given the details of your procedure findings, then the procedure report has been included in a sealed envelope for you to review at your convenience later.  YOU SHOULD EXPECT: Some feelings of bloating in the abdomen. Passage of more gas than usual.  Walking can help get rid of the air that was put into your GI tract during the procedure and reduce the bloating. If you had a lower endoscopy (such as a colonoscopy or flexible sigmoidoscopy) you may notice spotting of blood in your stool or on the toilet paper. If you underwent a bowel prep for your procedure, you may not have a normal bowel movement for a few days.  Please Note:  You might notice some irritation and congestion in your nose or some drainage.  This is from the oxygen used during your procedure.  There is no need for concern and it should clear up in a day or so.  SYMPTOMS TO REPORT IMMEDIATELY:  Following lower endoscopy (colonoscopy or flexible sigmoidoscopy):  Excessive amounts of blood in the stool  Significant tenderness or worsening of abdominal pains  Swelling of the abdomen that is new, acute  Fever of 100F or higher   For urgent or emergent issues, a gastroenterologist can be reached at any hour by calling [phone]. Do not use MyChart messaging for urgent concerns.    DIET:  We do recommend a small meal at first, but then you may proceed to your regular diet.  Drink plenty of fluids but you should avoid alcoholic beverages for 24 hours.  ACTIVITY:  You should plan to take it easy  for the rest of today and you should NOT DRIVE or use heavy machinery until tomorrow (because of the sedation medicines used during the test).    FOLLOW UP: Our staff will call the number listed on your records the next business day following your procedure.  We will call around 7:15- 8:00 am to check on you and address any questions or concerns that you may have regarding the information given to you following your procedure. If we do not reach you, we will leave a message.     If any biopsies were taken you will be contacted by phone or by letter within the next 1-3 weeks.  Please call us at [phone] if you have not heard about the biopsies in 3 weeks.    SIGNATURES/CONFIDENTIALITY: You and/or your care partner have signed paperwork which will be entered into your electronic medical record.  These signatures attest to the fact that that the information above on your After Visit Summary has been reviewed and is understood.  Full responsibility of the confidentiality of this discharge information lies with you and/or your care-partner. 

## 2022-09-23 NOTE — Progress Notes (Signed)
[facility] History and Physical   Primary Care Physician:  [facility], [place]   Reason for Procedure:  Colorectal cancer screening  Plan:    Screening colonoscopy with possible interventions as needed     HPI: Anne Key is a very pleasant 52 y.o. female here for screening colonoscopy. Denies any nausea, vomiting, abdominal pain, melena or bright red blood per rectum  The risks and benefits as well as alternatives of endoscopic procedure(s) have been discussed and reviewed. All questions answered. The patient agrees to proceed.    Past Medical History:  Diagnosis Date   Abnormal Pap smear 07/20/1990   LEEP   Diabetes mellitus without complication ([place])    Fibroadenoma 07/21/1991   right breast   H/O insomnia 02/10/2011   H/O varicella    History of fatigue 02/10/2011   Hyperlipidemia    Hypertension    Palpitations    rare    Past Surgical History:  Procedure Laterality Date   LEEP  2002   decondary to an  abnormal pap smear   needle bipsy of left breast     2009   right breast fibroadenoma     removed in [place]      Prior to Admission medications   Medication Sig Start Date End Date Taking? Authorizing Provider  atorvastatin (LIPITOR) 40 MG tablet 40 mg daily. 10/02/20  Yes [provider]  Cholecalciferol 25 MCG (1000 UT) tablet 1,000 Units daily. 06/20/12  Yes [provider]  ezetimibe (ZETIA) 10 MG tablet 10 mg daily. 03/10/22  Yes [provider]  losartan (COZAAR) 100 MG tablet 100 mg daily.   Yes [provider]  metFORMIN (GLUCOPHAGE-XR) 500 MG 24 hr tablet 500 mg daily with breakfast.   Yes [provider]  Semaglutide (RYBELSUS) 7 MG TABS Take by mouth daily. 06/09/21  Yes [provider]    Current Outpatient Medications  Medication Sig Dispense Refill   atorvastatin (LIPITOR) 40 MG tablet 40 mg daily.     Cholecalciferol 25 MCG (1000 UT) tablet  1,000 Units daily.     ezetimibe (ZETIA) 10 MG tablet 10 mg daily.     losartan (COZAAR) 100 MG tablet 100 mg daily.     metFORMIN (GLUCOPHAGE-XR) 500 MG 24 hr tablet 500 mg daily with breakfast.     Semaglutide (RYBELSUS) 7 MG TABS Take by mouth daily.     Current Facility-Administered Medications  Medication Dose Route Frequency Provider Last Rate Last Admin   0.9 %  sodium chloride infusion  500 mL Intravenous Once Mauri Pole, MD        Allergies as of 09/23/2022   (No Known Allergies)    Family History  Problem Relation Age of Onset   Heart attack Mother 23   Stroke Mother        x2   Alzheimer's disease Mother    Diabetes Mother    Heart disease Mother    Hypertension Mother    Multiple sclerosis Father    Hypertension Father    Colon cancer Neg Hx    Esophageal cancer Neg Hx    Stomach cancer Neg Hx    Rectal cancer Neg Hx     Social History   Socioeconomic History   Marital status: Single    Spouse name: Not on file   Number of children: Not on file   Years of education: Not on file   Highest education level: Not on file  Occupational History   Not on file  Tobacco Use   Smoking status: Never   Smokeless tobacco: Never  Vaping Use   Vaping Use: Never used  Substance and Sexual Activity   Alcohol use: No   Drug use: No   Sexual activity: Not Currently    Birth control/protection: Abstinence  Other Topics Concern   Not on file  Social History Narrative   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    Social Determinants of Health   Financial Resource Strain: Not on file  Food Insecurity: Not on file  Transportation Needs: Not on file  Physical Activity: Not on file  Stress: Not on file  Social Connections: Not on file  Intimate Partner Violence: Not on file    Review of Systems:  All other review of systems negative except as mentioned in the HPI.  Physical Exam: Vital  signs in last 24 hours: Blood Pressure 125/75   Pulse 85   Temperature 98.4 F (36.9 C) (Temporal)   Height '5\' 7"'$  (1.702 m)   Weight 206 lb (93.4 kg)   Last Menstrual Period 09/16/2022 (Exact Date)   Oxygen Saturation 96%   Body Mass Index 32.26 kg/m  General:   Alert, NAD Lungs:  Clear .   Heart:  Regular rate and rhythm Abdomen:  Soft, nontender and nondistended. Neuro/Psych:  Alert and cooperative. Normal mood and affect. A and O x 3  Reviewed labs, radiology imaging, old records and pertinent past GI work up  Patient is appropriate for planned procedure(s) and anesthesia in an ambulatory setting   K. Denzil Magnuson , MD [phone]

## 2022-09-23 NOTE — Progress Notes (Signed)
Uneventful anesthetic. Report to pacu rn. Vss. Care resumed by rn.

## 2022-09-23 NOTE — Progress Notes (Signed)
Pt's states no medical or surgical changes since previsit or office visit. 

## 2022-09-23 NOTE — Op Note (Addendum)
[place] Patient Name: Anne Key Procedure Date: 09/23/2022 11:08 AM MRN: [id number] Endoscopist: Mauri Pole , MD, [id number] Age: 52 Referring MD:  Date of Birth: 1971/01/08 Gender: Female Account #: [id number] Procedure:                Colonoscopy Indications:              Screening for colorectal malignant neoplasm Medicines:                Monitored Anesthesia Care Procedure:                Pre-Anesthesia Assessment:                           - Prior to the procedure, a History and Physical                            was performed, and patient medications and                            allergies were reviewed. The patient's tolerance of                            previous anesthesia was also reviewed. The risks                            and benefits of the procedure and the sedation                            options and risks were discussed with the patient.                            All questions were answered, and informed consent                            was obtained. Prior Anticoagulants: The patient has                            taken no anticoagulant or antiplatelet agents. ASA                            Grade Assessment: II - A patient with mild systemic                            disease. After reviewing the risks and benefits,                            the patient was deemed in satisfactory condition to                            undergo the procedure.                           After obtaining informed consent, the colonoscope  was passed under direct vision. Throughout the                            procedure, the patient's blood pressure, pulse, and                            oxygen saturations were monitored continuously. The                            Olympus PCF-H190DL [id number] Colonoscope was                            introduced through the anus and advanced to the the                            cecum,  identified by appendiceal orifice and                            ileocecal valve. The colonoscopy was performed                            without difficulty. The patient tolerated the                            procedure well. The quality of the bowel                            preparation was good. The ileocecal valve,                            appendiceal orifice, and rectum were photographed. Scope In: 11:20:38 AM Scope Out: 11:31:22 AM Scope Withdrawal Time: 0 hours 7 minutes 56 seconds  Total Procedure Duration: 0 hours 10 minutes 44 seconds  Findings:                 The perianal and digital rectal examinations were                            normal.                           A 5 mm polyp was found in the hepatic flexure. The                            polyp was sessile. The polyp was removed with a                            cold snare. Resection and retrieval were complete.                           Scattered large-mouthed, medium-mouthed and                            small-mouthed diverticula were found in the sigmoid  colon, descending colon, transverse colon and                            ascending colon.                           Non-bleeding external and internal hemorrhoids were                            found during retroflexion. The hemorrhoids were                            small. Complications:            No immediate complications. Estimated Blood Loss:     Estimated blood loss was minimal. Impression:               - One 5 mm polyp at the hepatic flexure, removed                            with a cold snare. Resected and retrieved.                           - Diverticulosis in the sigmoid colon, in the                            descending colon, in the transverse colon and in                            the ascending colon.                           - Non-bleeding external and internal hemorrhoids. Recommendation:           - Patient  has a contact number available for                            emergencies. The signs and symptoms of potential                            delayed complications were discussed with the                            patient. Return to normal activities tomorrow.                            Written discharge instructions were provided to the                            patient.                           - Resume previous diet.                           - Continue present medications.                           -  Await pathology results.                           - Repeat colonoscopy in 5-10 years for surveillance                            based on pathology results. Mauri Pole, MD 09/23/2022 11:36:51 AM This report has been signed electronically.

## 2022-09-23 NOTE — Progress Notes (Signed)
Called to room to assist during endoscopic procedure.  Patient ID and intended procedure confirmed with present staff. Received instructions for my participation in the procedure from the performing physician.  

## 2022-09-24 ENCOUNTER — Telehealth: Payer: Self-pay

## 2022-09-24 NOTE — Telephone Encounter (Signed)
  Follow up Call-     09/23/2022   10:24 AM  Call back number  Post procedure Call Back phone  # [phone]  Permission to leave phone message Yes     Left message

## 2022-10-01 ENCOUNTER — Encounter: Payer: Self-pay | Admitting: Gastroenterology

## 2022-10-13 DIAGNOSIS — I10 Essential (primary) hypertension: Secondary | ICD-10-CM | POA: Diagnosis not present

## 2022-10-13 DIAGNOSIS — E7800 Pure hypercholesterolemia, unspecified: Secondary | ICD-10-CM | POA: Diagnosis not present

## 2022-10-13 DIAGNOSIS — E1165 Type 2 diabetes mellitus with hyperglycemia: Secondary | ICD-10-CM | POA: Diagnosis not present

## 2023-01-13 DIAGNOSIS — E1169 Type 2 diabetes mellitus with other specified complication: Secondary | ICD-10-CM | POA: Diagnosis not present

## 2023-01-13 DIAGNOSIS — E1165 Type 2 diabetes mellitus with hyperglycemia: Secondary | ICD-10-CM | POA: Diagnosis not present

## 2023-01-13 DIAGNOSIS — E7800 Pure hypercholesterolemia, unspecified: Secondary | ICD-10-CM | POA: Diagnosis not present

## 2023-01-13 DIAGNOSIS — I10 Essential (primary) hypertension: Secondary | ICD-10-CM | POA: Diagnosis not present

## 2023-05-06 DIAGNOSIS — R5383 Other fatigue: Secondary | ICD-10-CM | POA: Diagnosis not present

## 2023-05-06 DIAGNOSIS — E7800 Pure hypercholesterolemia, unspecified: Secondary | ICD-10-CM | POA: Diagnosis not present

## 2023-05-06 DIAGNOSIS — B351 Tinea unguium: Secondary | ICD-10-CM | POA: Diagnosis not present

## 2023-05-06 DIAGNOSIS — R252 Cramp and spasm: Secondary | ICD-10-CM | POA: Diagnosis not present

## 2023-05-06 DIAGNOSIS — E1169 Type 2 diabetes mellitus with other specified complication: Secondary | ICD-10-CM | POA: Diagnosis not present

## 2023-05-06 DIAGNOSIS — I10 Essential (primary) hypertension: Secondary | ICD-10-CM | POA: Diagnosis not present

## 2023-05-11 DIAGNOSIS — B351 Tinea unguium: Secondary | ICD-10-CM | POA: Diagnosis not present

## 2023-06-22 DIAGNOSIS — Z01419 Encounter for gynecological examination (general) (routine) without abnormal findings: Secondary | ICD-10-CM | POA: Diagnosis not present

## 2023-06-22 DIAGNOSIS — Z1339 Encounter for screening examination for other mental health and behavioral disorders: Secondary | ICD-10-CM | POA: Diagnosis not present

## 2023-06-22 DIAGNOSIS — Z1231 Encounter for screening mammogram for malignant neoplasm of breast: Secondary | ICD-10-CM | POA: Diagnosis not present

## 2023-07-28 DIAGNOSIS — Z0000 Encounter for general adult medical examination without abnormal findings: Secondary | ICD-10-CM | POA: Diagnosis not present

## 2023-07-28 DIAGNOSIS — E7800 Pure hypercholesterolemia, unspecified: Secondary | ICD-10-CM | POA: Diagnosis not present

## 2023-07-28 DIAGNOSIS — E118 Type 2 diabetes mellitus with unspecified complications: Secondary | ICD-10-CM | POA: Diagnosis not present

## 2023-07-28 DIAGNOSIS — I10 Essential (primary) hypertension: Secondary | ICD-10-CM | POA: Diagnosis not present

## 2023-08-18 DIAGNOSIS — E559 Vitamin D deficiency, unspecified: Secondary | ICD-10-CM | POA: Diagnosis not present

## 2023-08-18 DIAGNOSIS — I10 Essential (primary) hypertension: Secondary | ICD-10-CM | POA: Diagnosis not present

## 2023-09-15 DIAGNOSIS — M2012 Hallux valgus (acquired), left foot: Secondary | ICD-10-CM | POA: Diagnosis not present

## 2023-09-15 DIAGNOSIS — M7731 Calcaneal spur, right foot: Secondary | ICD-10-CM | POA: Diagnosis not present

## 2023-09-15 DIAGNOSIS — M2011 Hallux valgus (acquired), right foot: Secondary | ICD-10-CM | POA: Diagnosis not present

## 2023-09-15 DIAGNOSIS — M722 Plantar fascial fibromatosis: Secondary | ICD-10-CM | POA: Diagnosis not present

## 2023-10-06 DIAGNOSIS — M2011 Hallux valgus (acquired), right foot: Secondary | ICD-10-CM | POA: Diagnosis not present

## 2023-10-06 DIAGNOSIS — M2012 Hallux valgus (acquired), left foot: Secondary | ICD-10-CM | POA: Diagnosis not present

## 2023-10-06 DIAGNOSIS — M722 Plantar fascial fibromatosis: Secondary | ICD-10-CM | POA: Diagnosis not present

## 2023-10-06 DIAGNOSIS — M7731 Calcaneal spur, right foot: Secondary | ICD-10-CM | POA: Diagnosis not present

## 2023-10-08 ENCOUNTER — Other Ambulatory Visit: Payer: Self-pay | Admitting: Nurse Practitioner

## 2023-10-08 DIAGNOSIS — Z1231 Encounter for screening mammogram for malignant neoplasm of breast: Secondary | ICD-10-CM

## 2023-11-11 DIAGNOSIS — I10 Essential (primary) hypertension: Secondary | ICD-10-CM | POA: Diagnosis not present

## 2023-11-11 DIAGNOSIS — E118 Type 2 diabetes mellitus with unspecified complications: Secondary | ICD-10-CM | POA: Diagnosis not present

## 2023-11-11 DIAGNOSIS — E7800 Pure hypercholesterolemia, unspecified: Secondary | ICD-10-CM | POA: Diagnosis not present

## 2024-03-28 DIAGNOSIS — E7800 Pure hypercholesterolemia, unspecified: Secondary | ICD-10-CM | POA: Diagnosis not present

## 2024-03-28 DIAGNOSIS — I10 Essential (primary) hypertension: Secondary | ICD-10-CM | POA: Diagnosis not present

## 2024-03-28 DIAGNOSIS — E1169 Type 2 diabetes mellitus with other specified complication: Secondary | ICD-10-CM | POA: Diagnosis not present

## 2024-06-23 DIAGNOSIS — Z1231 Encounter for screening mammogram for malignant neoplasm of breast: Secondary | ICD-10-CM | POA: Diagnosis not present

## 2024-06-23 DIAGNOSIS — Z1330 Encounter for screening examination for mental health and behavioral disorders, unspecified: Secondary | ICD-10-CM | POA: Diagnosis not present

## 2024-06-23 DIAGNOSIS — N920 Excessive and frequent menstruation with regular cycle: Secondary | ICD-10-CM | POA: Diagnosis not present

## 2024-06-23 DIAGNOSIS — D259 Leiomyoma of uterus, unspecified: Secondary | ICD-10-CM | POA: Diagnosis not present

## 2024-06-23 DIAGNOSIS — Z1211 Encounter for screening for malignant neoplasm of colon: Secondary | ICD-10-CM | POA: Diagnosis not present

## 2024-06-23 DIAGNOSIS — Z01419 Encounter for gynecological examination (general) (routine) without abnormal findings: Secondary | ICD-10-CM | POA: Diagnosis not present

## 2024-06-23 DIAGNOSIS — Z124 Encounter for screening for malignant neoplasm of cervix: Secondary | ICD-10-CM | POA: Diagnosis not present

## 2024-07-07 ENCOUNTER — Inpatient Hospital Stay: Admission: RE | Admit: 2024-07-07 | Discharge: 2024-07-07 | Attending: Nurse Practitioner

## 2024-07-07 ENCOUNTER — Other Ambulatory Visit: Payer: Self-pay | Admitting: Obstetrics and Gynecology

## 2024-07-07 DIAGNOSIS — Z1231 Encounter for screening mammogram for malignant neoplasm of breast: Secondary | ICD-10-CM
